# Patient Record
Sex: Male | Born: 1970
Health system: Southern US, Community
[De-identification: ages and names within clinical notes are randomized; demographics above are authoritative.]

## PROBLEM LIST (undated history)

## (undated) DIAGNOSIS — M109 Gout, unspecified: Secondary | ICD-10-CM

## (undated) HISTORY — PX: ACHILLES TENDON SURGERY: SHX542

---

## 2004-08-15 ENCOUNTER — Emergency Department: Payer: Self-pay | Admitting: Emergency Medicine

## 2005-03-21 ENCOUNTER — Ambulatory Visit: Payer: Self-pay | Admitting: Orthopaedic Surgery

## 2005-03-25 ENCOUNTER — Ambulatory Visit: Payer: Self-pay | Admitting: Orthopaedic Surgery

## 2011-04-05 ENCOUNTER — Ambulatory Visit (INDEPENDENT_AMBULATORY_CARE_PROVIDER_SITE_OTHER): Payer: Self-pay | Admitting: Sports Medicine

## 2011-04-05 ENCOUNTER — Encounter: Payer: Self-pay | Admitting: Sports Medicine

## 2011-04-05 DIAGNOSIS — M7660 Achilles tendinitis, unspecified leg: Secondary | ICD-10-CM | POA: Insufficient documentation

## 2011-04-05 DIAGNOSIS — M79609 Pain in unspecified limb: Secondary | ICD-10-CM

## 2011-04-05 DIAGNOSIS — M109 Gout, unspecified: Secondary | ICD-10-CM

## 2011-04-05 DIAGNOSIS — M79674 Pain in right toe(s): Secondary | ICD-10-CM | POA: Insufficient documentation

## 2011-04-05 MED ORDER — DICLOFENAC SODIUM 75 MG PO TBEC: DELAYED_RELEASE_TABLET | ORAL | Status: DC

## 2011-04-05 NOTE — Patient Instructions (Signed)
Start diclofenac as directed  Please have lab work done  Schedule an appointment to have custom orthotics made  Thank you for seeing [place] today!

## 2011-04-05 NOTE — Progress Notes (Signed)
  Subjective:    Patient ID: Darius Robinson, male    DOB: Mar 07, 1971, 41 y.o.   MRN: [id number]  HPI  Pt presents to clinic for evaluation of rt great toe pain at 1st MTP x 3 days. Went for a long run on treadmill Sunday- increased mileage.   Woke up Monday with very painful and swollen rt 1st MTP joint.  Hx of rt achilles tendon rupture and repair in 2007.  Both ATs still feel tight.  Cycles and getting back into running now, has been increasing mileage lately.      Review of Systems     Objective:   Physical Exam  Patient walks with an obvious limp  The right great toe is reddened and swollen and there is redness that extends up to the midfoot and over to the second metatarsal  The great toe is exquisitely tender to light touch  He has a Morton's foot with a short first toe and has some hallux valgus change and spurring  MSK ultrasound  reveals hypoechoic change around the great toe Probable crystal change in the joint Spurring more on the medial side of the first MTP  Right Achilles tendon is thickened at over 1 cm about 4 cm above the calcaneus and it still shows it to hypoechoic suture line On transverse view there is some mid tendon splitting  Left Achilles tendon shows some nodular thickening about 4 cm above the calcaneus with a maximal width of 0.86      Assessment & Plan:

## 2011-04-05 NOTE — Assessment & Plan Note (Signed)
We will go ahead and check renal function with a BMET and uric acid level  Recheck in 3-4 weeks

## 2011-04-05 NOTE — Assessment & Plan Note (Signed)
We will use high-dose diclofenac therapy to see if we can break this gout attack was 300 mg x2 days then 225x2 days and then 150 daily for the next couple weeks

## 2011-04-05 NOTE — Assessment & Plan Note (Signed)
I think this is triggered by his foot shape and I would like to test his running gait  He needs orthotics and Achilles exercises once his gout resolves  He may be a candidate for nitroglycerin

## 2012-02-26 ENCOUNTER — Ambulatory Visit (INDEPENDENT_AMBULATORY_CARE_PROVIDER_SITE_OTHER): Payer: PRIVATE HEALTH INSURANCE | Admitting: Sports Medicine

## 2012-02-26 VITALS — BP 150/90 | Ht 72.0 in | Wt 200.0 lb

## 2012-02-26 DIAGNOSIS — M7660 Achilles tendinitis, unspecified leg: Secondary | ICD-10-CM

## 2012-02-26 DIAGNOSIS — M25571 Pain in right ankle and joints of right foot: Secondary | ICD-10-CM | POA: Insufficient documentation

## 2012-02-26 DIAGNOSIS — M25579 Pain in unspecified ankle and joints of unspecified foot: Secondary | ICD-10-CM

## 2012-02-26 DIAGNOSIS — M25572 Pain in left ankle and joints of left foot: Secondary | ICD-10-CM

## 2012-02-26 MED ORDER — DICLOFENAC SODIUM 75 MG PO TBEC: DELAYED_RELEASE_TABLET | ORAL | Status: DC

## 2012-02-26 NOTE — Patient Instructions (Signed)
Very nice to meet you.  You do have a bone spur on the front of your foot causing some of the pain.  Wear the compression sleeve with activity and 2 hours afterward.  Ice 20 minutes daily at some time. Where the orthotics in your work out shoes and see if this helps.  Come back in 4 weeks to make sure you are doing better. We can make changes to the orthotic if we need it.  Happy holidays!

## 2012-02-26 NOTE — Progress Notes (Signed)
Chief complaint: Left ankle pain and swelling  History of present illness: Patient is a 41 year old male who over Thanksgiving was playing ultimate Frisbee and soccer and the next day had severe ankle pain and swelling. Patient states since that time he is continued to have swelling and pain mostly the anterior ankle. Patient states from time to time it seems to be hard to walk and it hurts with any type of exercise. Patient 10 days ago did see a podiatrist at [facility] and they did have x-rays which showed an anterior bone spur but did not give him any treatment and told him that he will get better on its own or not. Patient is here for a second opinion. Patient states that he started doing some exercises yesterday which did improve the swelling and states that maybe it helped with some of the pain. Patient though still is unable to do  certain things he used to do on a regular basis. Denies numbness or loss of strength.  Past medical history, social, surgical and family history all reviewed. He was treated here for a flare of gout the past as well as Achilles tendinitis.  14 system review is done and unremarkable as related to the orthopedic problem.  Physical exam Blood pressure 150/90, height 6' (1.829 m), weight 200 lb (90.719 kg). General: No apparent distress alert and oriented x3 mood and affect somewhat normal but blunted. Respiratory: Patient's recent full sentences and does not appear short of breath Skin: Warm dry intact with no signs of infection or rash Neuro: Cranial nerves II through XII are intact, neurovascularly intact in all extremities with 2+ DTRs and 2+ pulses.  Foot exam: Patient does have Morton's toe as well as a short first metatarsal bilaterally. He has bunion formation on both the left and right foot with degenerative spurring noted. Left ankle exam: Patient is tender to palpation over the talus, cuboid and proximal navicular bone on the dorsal aspect of  the foot. Patient does have good full range of motion. He is neurovascularly intact distally. There is no pain over the medial or lateral malleolus. No pain over the base of the fifth metatarsal.  Musculoskeletal ultrasound was performed and interpreted by me today. Patient's ultrasound did show that he does have 2 significant bony spurs on anterior part of his ankle in his forefoot. One is on the proximal aspect of his navicular and one is on the proximal  cuboid. Both these have hypoechoic changes with neovascularization. This does show some acute changes overall but no true fracture appreciated. Patient's ankle joint itself appeared to be without fracture.  Patient does have nodules still stated of his Achilles bilaterally right greater than left but significantly smaller than previous and nontender on exam.

## 2012-02-26 NOTE — Assessment & Plan Note (Signed)
We'll continue to monitor his chronic Achilles tendinitis. This does not seem to be bothering him at this time but we do not want him to compensate secondary to the pain he is feeling with the left ankle. Patient was given sports insults today with heel lift which will hopefully decrease the amount of strain. We'll continue to monitor and will discuss again in 4 weeks' time.

## 2012-02-26 NOTE — Assessment & Plan Note (Addendum)
Patient's left ankle pain is consistent with the spurs with mild arthritic changes seen on ultrasound today. I do think patient though did have an acute flare of this injury. Patient was given a compression sleeve today as well as will do 10 days of anti-inflammatory. Patient given exercises to do to try to help. Patient was also given some insoles with heel lifts to decrease the amount of stress on his Achilles. In addition to this patient was given the first ray post bilaterally to try to decrease the strain on the first toe secondary to his Morton's toe.  Recheck as needed.

## 2012-03-31 ENCOUNTER — Ambulatory Visit: Payer: PRIVATE HEALTH INSURANCE | Admitting: Sports Medicine

## 2012-04-07 ENCOUNTER — Ambulatory Visit (INDEPENDENT_AMBULATORY_CARE_PROVIDER_SITE_OTHER): Payer: PRIVATE HEALTH INSURANCE | Admitting: Sports Medicine

## 2012-04-07 VITALS — BP 125/80 | Ht 72.0 in | Wt 200.0 lb

## 2012-04-07 DIAGNOSIS — M25579 Pain in unspecified ankle and joints of unspecified foot: Secondary | ICD-10-CM

## 2012-04-07 DIAGNOSIS — M25571 Pain in right ankle and joints of right foot: Secondary | ICD-10-CM

## 2012-04-07 NOTE — Progress Notes (Signed)
HPI: Patient is a 42 y/o male who presents for followup of right ankle pain (error in previous documentation reporting left ankle pain). Seen on December 18th.  Ultrasound done that day showing arthritic changes and bony spurs of proximal aspect of his navicular and one on the proximal cuboid.  Patient states that he was noted to have bone spur and small fracture of one of the bones of his foot pointing at navicular.  On December 18th visit, patient was given 10-day course anti-inflammatory, exercises, compression sleeve as well as insoles with heel lifts to decrease stress on Achilles.  Patient reports that his pain is better and he can now run without pain, although he is doing mostly cycling.   No further concerns at this time.  ROS: Negative except as above.  Physical Exam: General: well-developed, well nourished, NAD Right ankle: Limited dorsi-and plantar-flexion; 5/5 strength; neurovascularly intact distally; no point tenderness of navicular, cuneiform, talus, or calcaneus; no swelling or ecchymosis noted; patient does have Morton's toe and bunions of both feet. Thickening of right achilles tendon noted. Scar from previous repair Left ankle and foot: Neurovascularly intact distally.  Limited dorsiflexion with thickening of achilles tendon.  Morton's toe noted.  Has full range of plantar flexion.    A/P: Left ankle pain:  Patient making excellent progress.  Feels that pain is nearly completely resolved.  Will continue with shoe insoles, having patient be as active as he tolerates, continue ankle exercises.  Patient to return if he needs replacement of insoles in shoes.

## 2012-04-07 NOTE — Assessment & Plan Note (Signed)
This is much improved. See the assessment in his progress note. We will keep him on exercises, compression sleeves and sports insoles with lifts. He will return as needed.

## 2015-07-13 ENCOUNTER — Encounter: Payer: Self-pay | Admitting: Family Medicine

## 2015-07-13 ENCOUNTER — Ambulatory Visit (INDEPENDENT_AMBULATORY_CARE_PROVIDER_SITE_OTHER): Payer: Commercial Managed Care - PPO | Admitting: Family Medicine

## 2015-07-13 ENCOUNTER — Ambulatory Visit
Admission: RE | Admit: 2015-07-13 | Discharge: 2015-07-13 | Disposition: A | Discharge status: Home / Self Care | Payer: PRIVATE HEALTH INSURANCE | Source: Ambulatory Visit | Attending: Family Medicine | Admitting: Family Medicine

## 2015-07-13 VITALS — BP 146/98 | Ht 72.0 in | Wt 204.0 lb

## 2015-07-13 DIAGNOSIS — M79674 Pain in right toe(s): Secondary | ICD-10-CM | POA: Diagnosis not present

## 2015-07-13 DIAGNOSIS — M21619 Bunion of unspecified foot: Secondary | ICD-10-CM

## 2015-07-13 MED ORDER — METHYLPREDNISOLONE ACETATE 40 MG/ML IJ SUSP
40.0000 mg | Freq: Once | INTRAMUSCULAR | Status: AC
  Administered 2015-07-13: 40 mg via INTRA_ARTICULAR

## 2015-07-13 MED ORDER — COLCHICINE 0.6 MG PO TABS: 0.6000 mg | ORAL_TABLET | Freq: Every day | ORAL | Status: DC

## 2015-07-13 NOTE — Assessment & Plan Note (Addendum)
Very well could be gout vs irritation of bunion.   Will get standing A/P B/L feet to evaluate for erosions and tophus changes as well as BMP and uric acid.  Injection today into MTP joint for Sx relief and attempt at aspiration.  F/U in 2-4 weeks for orthotics as well as consideration of urate lowering therapy pending results from above.  As well, consider aspiration of tophus appearing lesion of MTP and sending for crystal analysis.    Aspiration/Injection Procedure Note Darius Robinson 01/28/1971  Procedure: Injection Indications: 1st MTP pain  Procedure Details Consent: Risks of procedure as well as the alternatives and risks of each were explained to the (patient/caregiver).  Consent for procedure obtained. Time Out: Verified patient identification, verified procedure, site/side was marked, verified correct patient position, special equipment/implants available, medications/allergies/relevent history reviewed, required imaging and test results available.  Performed.  The area was cleaned with iodine and alcohol swabs.    The R MTP 1st was injected using 0.5 cc's of 40mg  Depomedrol and 0.5 cc's of 1% lidocaine with a 21 1 1/2" needle.  Ultrasound was used. Images were obtained in Transverse and Long views showing the injection.    A sterile dressing was applied.  Patient did tolerate procedure well.  Vasovagal event for 30 seconds and he sat in the exam room for 30 minutes with water, able to ambulate outside.  No bowel/bladder/tongue biting or shaking.  Remembers specific event and stable at time of d/c.  Estimated blood loss: None

## 2015-07-13 NOTE — Progress Notes (Signed)
  Darius Robinson - 45 y.o. male MRN [id number]  Date of birth: [birth year] Darius Robinson is a 45 y.o. male who presents today for right toe pain.  R first MTP joint, initial visit, 07/13/15 - patient presents today for ongoing right first MTP pain and erythema. This began to 2 weeks ago after working out and has not subsided. He does have a history of gout thinks that this may be similar. Denies any previous injury to his right or left foot. There is protuberances on both medial aspects of his first MTP joints. He has not tried orthotics medication or anything else to date. No paresthesias going distally. He denies any fevers chills night sweats or spreading erythema.  PMHx - Updated and reviewed.  Contributory factors include: gout PSHx - Updated and reviewed.  Contributory factors include:  Negative  FHx - Updated and reviewed.  Contributory factors include:  Negative Social Hx - Updated and reviewed. Contributory factors include: Previous smoker  Medications - none   ROS Per HPI.  12 point negative other than per HPI.   Exam:  Filed Vitals:   07/13/15 0942  BP: 146/98   Gen: NAD, AAO 3 Cardio- RRR Pulm - Normal respiratory effort/rate Skin: No rashes or erythema Extremities: No edema  Vascular: pulses +2 bilateral upper and lower extremity Psych: Normal affect  Foot:  1st MTP erythema w/o warmth but TTP and slight hallux valgus on R.  Full ROM.  L with protuberance of medial MTP joint and minimal hallux valgus.  No warmth or erythema.    Imaging:  A/P B/L Foot

## 2015-07-14 ENCOUNTER — Telehealth: Payer: Self-pay | Admitting: Family Medicine

## 2015-07-14 NOTE — Telephone Encounter (Signed)
LVM for pt to call back regarding his x-rays.  Both L&R 1st MTP joint show significant OA and bunion deformity.  Gout cannot be excluded.  Would recommend the labs that are ordered and f/u after this.  If he has any questions, please relay the message back and I will call him again.  Thanks, Twana FirstBryan R. Paulina FusiHess, DO [facility] 07/14/2015, 11:17 AM

## 2015-08-09 ENCOUNTER — Ambulatory Visit: Payer: PRIVATE HEALTH INSURANCE | Admitting: Family Medicine

## 2015-08-11 ENCOUNTER — Encounter: Payer: Self-pay | Admitting: Sports Medicine

## 2015-08-16 ENCOUNTER — Ambulatory Visit: Payer: PRIVATE HEALTH INSURANCE | Admitting: Family Medicine

## 2015-08-23 ENCOUNTER — Ambulatory Visit (INDEPENDENT_AMBULATORY_CARE_PROVIDER_SITE_OTHER): Payer: Commercial Managed Care - PPO | Admitting: Family Medicine

## 2015-08-23 ENCOUNTER — Encounter: Payer: Self-pay | Admitting: Family Medicine

## 2015-08-23 VITALS — BP 107/88 | HR 67 | Ht 72.0 in | Wt 200.0 lb

## 2015-08-23 DIAGNOSIS — M10079 Idiopathic gout, unspecified ankle and foot: Secondary | ICD-10-CM

## 2015-08-23 DIAGNOSIS — R269 Unspecified abnormalities of gait and mobility: Secondary | ICD-10-CM

## 2015-08-23 DIAGNOSIS — M79674 Pain in right toe(s): Secondary | ICD-10-CM | POA: Diagnosis not present

## 2015-08-23 DIAGNOSIS — M109 Gout, unspecified: Secondary | ICD-10-CM

## 2015-08-23 NOTE — Progress Notes (Signed)
  Candis ShineJames A Plummer - 45 y.o. male MRN [id number]  Date of birth: 03/04/1971 Candis ShineJames A Izquierdo is a 45 y.o. male who presents today for right toe pain.  R first MTP joint, initial visit, 07/13/15 - patient presents today for ongoing right first MTP pain and erythema. This began to 2 weeks ago after working out and has not subsided. He does have a history of gout thinks that this may be similar. Denies any previous injury to his right or left foot. There is protuberances on both medial aspects of his first MTP joints. He has not tried orthotics medication or anything else to date. No paresthesias going distally. He denies any fevers chills night sweats or spreading erythema.  Right first MTP pain, follow-up visit 08/23/15-patient presents today follow-up of right first MTP pain. Greatly improved 2-3 days after her intra-articular injection. Has not had recurrence since then. 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 There is also evidence of gout with his uric acid level VIII.4 and slightly elevated creatinine at 1.2.  PMHx - Updated and reviewed.  Contributory factors include: gout PSHx - Updated and reviewed.  Contributory factors include:  Negative  FHx - Updated and reviewed.  Contributory factors include:  Negative Social Hx - Updated and reviewed. Contributory factors include: Previous smoker  Medications - none   ROS Per HPI.  12 point negative other than per HPI.   Exam:  Filed Vitals:   08/23/15 1419  BP: 107/88  Pulse: 67   Gen: NAD, AAO 3 Cardio- RRR Pulm - Normal respiratory effort/rate Skin: No rashes or erythema Extremities: No edema  Vascular: pulses +2 bilateral upper and lower extremity Psych: Normal affect  Foot:  1st MTP erythema w/o warmth but TTP and slight hallux valgus on R.  Full ROM.  L with protuberance of medial MTP joint and  minimal hallux valgus.  No warmth or erythema.    Imaging:  A/P B/L Foot - punched out lesions first distal metatarsal as well as soft tissue hyperlucency's at the medial first MTP joint. Minimal hallux valgus deformities bilaterally.

## 2015-08-23 NOTE — Assessment & Plan Note (Signed)
Patient was fitted for a standard, cushioned, semi-rigid orthotic. The orthotic was heated and afterward the patient stood on the orthotic blank positioned on the orthotic stand. The patient was positioned in subtalar neutral position and 10 degrees of ankle dorsiflexion in a weight bearing stance. After completion of molding, a stable base was applied to the orthotic blank. The blank was ground to a stable position for weight bearing. Size: 11 Base: blue EVA Additional Posting and Padding: 1st ray post The patient ambulated these, and they were very comfortable.  I spent 40 minutes with this patient, greater than 50% was face-to-face time counseling regarding the below diagnosis.

## 2015-08-23 NOTE — Assessment & Plan Note (Addendum)
I would diagnose patient with gout at this point however we do not have a true crystal analysis. However his x-ray has classic gout findings including tophus as well as punched out lesions of his first MTP joint bilaterally. As well as uric acid levels 8.4 which is extremely high. Although not having gout attacks greater than 2-3 per year with his underlying tophus formation at highly recommend urate lowering therapy. He will talk to his PCP about starting urloric vs allopurinol with properly monitoring drug levels and labs.

## 2015-08-23 NOTE — Assessment & Plan Note (Addendum)
Patient with underlying hallux valgus deformity right greater than left as well. With his underlying foot structure and abnormality of gait we will go ahead and fit him with orthotics with the first ray MTP post. Follow-up 6 weeks

## 2015-10-04 ENCOUNTER — Ambulatory Visit: Payer: Commercial Managed Care - PPO | Admitting: Sports Medicine

## 2016-04-22 ENCOUNTER — Encounter: Payer: Self-pay | Admitting: Family Medicine

## 2016-04-22 ENCOUNTER — Ambulatory Visit (INDEPENDENT_AMBULATORY_CARE_PROVIDER_SITE_OTHER): Payer: PRIVATE HEALTH INSURANCE | Admitting: Family Medicine

## 2016-04-22 VITALS — BP 130/78 | HR 79 | Temp 97.7°F | Resp 16 | Wt 224.0 lb

## 2016-04-22 DIAGNOSIS — M5441 Lumbago with sciatica, right side: Secondary | ICD-10-CM

## 2016-04-22 DIAGNOSIS — G8929 Other chronic pain: Secondary | ICD-10-CM

## 2016-04-22 DIAGNOSIS — M545 Low back pain, unspecified: Secondary | ICD-10-CM | POA: Insufficient documentation

## 2016-04-22 DIAGNOSIS — M109 Gout, unspecified: Secondary | ICD-10-CM

## 2016-04-22 MED ORDER — COLCHICINE 0.6 MG PO TABS: ORAL_TABLET | ORAL | 1 refills | Status: DC

## 2016-04-22 MED ORDER — INDOMETHACIN 50 MG PO CAPS: 50.0000 mg | ORAL_CAPSULE | Freq: Two times a day (BID) | ORAL | 5 refills | Status: DC | PRN

## 2016-04-22 NOTE — Progress Notes (Signed)
       Patient: Darius ShineJames A Hollenbeck Male    DOB: [birth year]   46 y.o.   MRN: [id number] Visit Date: 04/22/2016  Today's Provider: Mila Merryonald Fisher, MD   Chief Complaint  Patient presents with  . Joint Swelling   Subjective:    Left ankle and foot is swollen for 5 days. Patient does not recall injuring foot. Left foot is painful all the time. Some rednees in the foot.   Does not recall in recent injury. Has been exercising much more the last several weeks. Also has history of elevated uric acid and gout which he had controlled with diet and avoiding alcohol.  Has also had redness and swelling over left great toe for several days.   He also has long history of right sided low back pain which radiates into right lateral leg. Had tried multiple NSAIDs without relief. Has been treated by two chiropractors multiple times for at least 6 months over the last two years with no relief from his pain. Most recent treatment was 40 sessions with  Dr. Chriss Czarumayne with only temporary but incomplete improvement of pain. Often feels weak in leg, but no falls. He desires evaluation for possible invasive treatment.      No Known Allergies   Current Outpatient Prescriptions:  .  colchicine 0.6 MG tablet, Take 1 tablet (0.6 mg total) by mouth daily. (Patient not taking: Reported on 04/22/2016), Disp: 30 tablet, Rfl: 1  Review of Systems  Constitutional: Negative for appetite change, chills and fever.  Respiratory: Negative for chest tightness, shortness of breath and wheezing.   Cardiovascular: Negative for chest pain and palpitations.  Gastrointestinal: Negative for abdominal pain, nausea and vomiting.  Musculoskeletal: Positive for joint swelling.    Social History  Substance Use Topics  . Smoking status: Never Smoker  . Smokeless tobacco: Former NeurosurgeonUser    Types: Chew    Quit date: 03/11/2001  . Alcohol use Not on file   Objective:   BP 130/78 (BP Location: Right Arm, Patient Position: Sitting, Cuff Size:  Large)   Pulse 79   Temp 97.7 F (36.5 C) (Oral)   Resp 16   Wt 224 lb (101.6 kg)   SpO2 97%   BMI 30.38 kg/m   Physical Exam General appearance: alert, well developed, well nourished, cooperative and in no distress Head: Normocephalic, without obvious abnormality, atraumatic Red and swollen over left first MTP. Mild swelling and tenderness left medial ankle. Pain with joint movement. Minimal erythema.  Tender low back, R<L. Positive right straight leg. Normal s/s +4 right hip abduction.     Assessment & Plan:     1. Chronic right-sided low back pain with right-sided sciatica Failed extensive chiropractic therapy and oral medications. Will likely require invasive treatment.  - MR Lumbar Spine Wo Contrast; Future  2. Acute gout of foot, unspecified cause, unspecified laterality  - colchicine 0.6 MG tablet; Take 2 tablets for one day, then one daily as needed.  Dispense: 30 tablet; Refill: 1 - indomethacin (INDOCIN) 50 MG capsule; Take 1 capsule (50 mg total) by mouth 2 (two) times daily as needed.  Dispense: 20 capsule; Refill: 5  Discussed benefit of allopurinol to reduced allopurinol in serum and gout prophylaxis. He will consider starting after acute flare has resolved.       Mila Merryonald Fisher, MD  [facility]

## 2016-05-13 ENCOUNTER — Ambulatory Visit: Payer: Commercial Managed Care - PPO

## 2016-05-20 ENCOUNTER — Ambulatory Visit
Admission: RE | Admit: 2016-05-20 | Discharge: 2016-05-20 | Disposition: A | Discharge status: Home / Self Care | Payer: BLUE CROSS/BLUE SHIELD | Source: Ambulatory Visit | Attending: Family Medicine | Admitting: Family Medicine

## 2016-05-20 DIAGNOSIS — G8929 Other chronic pain: Secondary | ICD-10-CM | POA: Diagnosis not present

## 2016-05-20 DIAGNOSIS — M48061 Spinal stenosis, lumbar region without neurogenic claudication: Secondary | ICD-10-CM | POA: Diagnosis not present

## 2016-05-20 DIAGNOSIS — M4807 Spinal stenosis, lumbosacral region: Secondary | ICD-10-CM | POA: Insufficient documentation

## 2016-05-20 DIAGNOSIS — M5441 Lumbago with sciatica, right side: Secondary | ICD-10-CM | POA: Insufficient documentation

## 2016-05-21 ENCOUNTER — Telehealth: Payer: Self-pay | Admitting: *Deleted

## 2016-05-21 DIAGNOSIS — M5126 Other intervertebral disc displacement, lumbar region: Secondary | ICD-10-CM

## 2016-05-21 DIAGNOSIS — M5136 Other intervertebral disc degeneration, lumbar region: Secondary | ICD-10-CM

## 2016-05-21 NOTE — Telephone Encounter (Signed)
Please schedule referral to neurosurgery. Thanks! 

## 2016-05-21 NOTE — Telephone Encounter (Signed)
Patient was notified of results. Expressed understanding. Referral to neurosurgery in epic.

## 2016-05-21 NOTE — Telephone Encounter (Signed)
-----   Message from Malva Limesonald E Fisher, MD sent at 05/20/2016  3:10 PM EDT ----- MRI shows 2 bulging discs in spine, probably pinching spinal nerve. Need referral to neurosurgery for further evaluation and treatment.

## 2016-12-08 DIAGNOSIS — J0110 Acute frontal sinusitis, unspecified: Secondary | ICD-10-CM | POA: Diagnosis not present

## 2017-03-05 ENCOUNTER — Other Ambulatory Visit: Payer: Self-pay | Admitting: Family Medicine

## 2017-03-05 MED ORDER — COLCHICINE 0.6 MG PO TABS: ORAL_TABLET | ORAL | 0 refills | Status: DC

## 2017-03-05 NOTE — Progress Notes (Signed)
Change colchicine to Mitagare or Colcrys per notice from [facility].

## 2017-04-12 DIAGNOSIS — J0100 Acute maxillary sinusitis, unspecified: Secondary | ICD-10-CM | POA: Diagnosis not present

## 2017-07-29 IMAGING — CR DG FOOT 2V*R*
2 series · 2 of 2 positions shown · non-contrast
Comparison: No prior .

CLINICAL DATA: Bunions.  Pain.  Initial evaluation.

EXAM:
RIGHT FOOT - 2 VIEW

[t foot ap right]
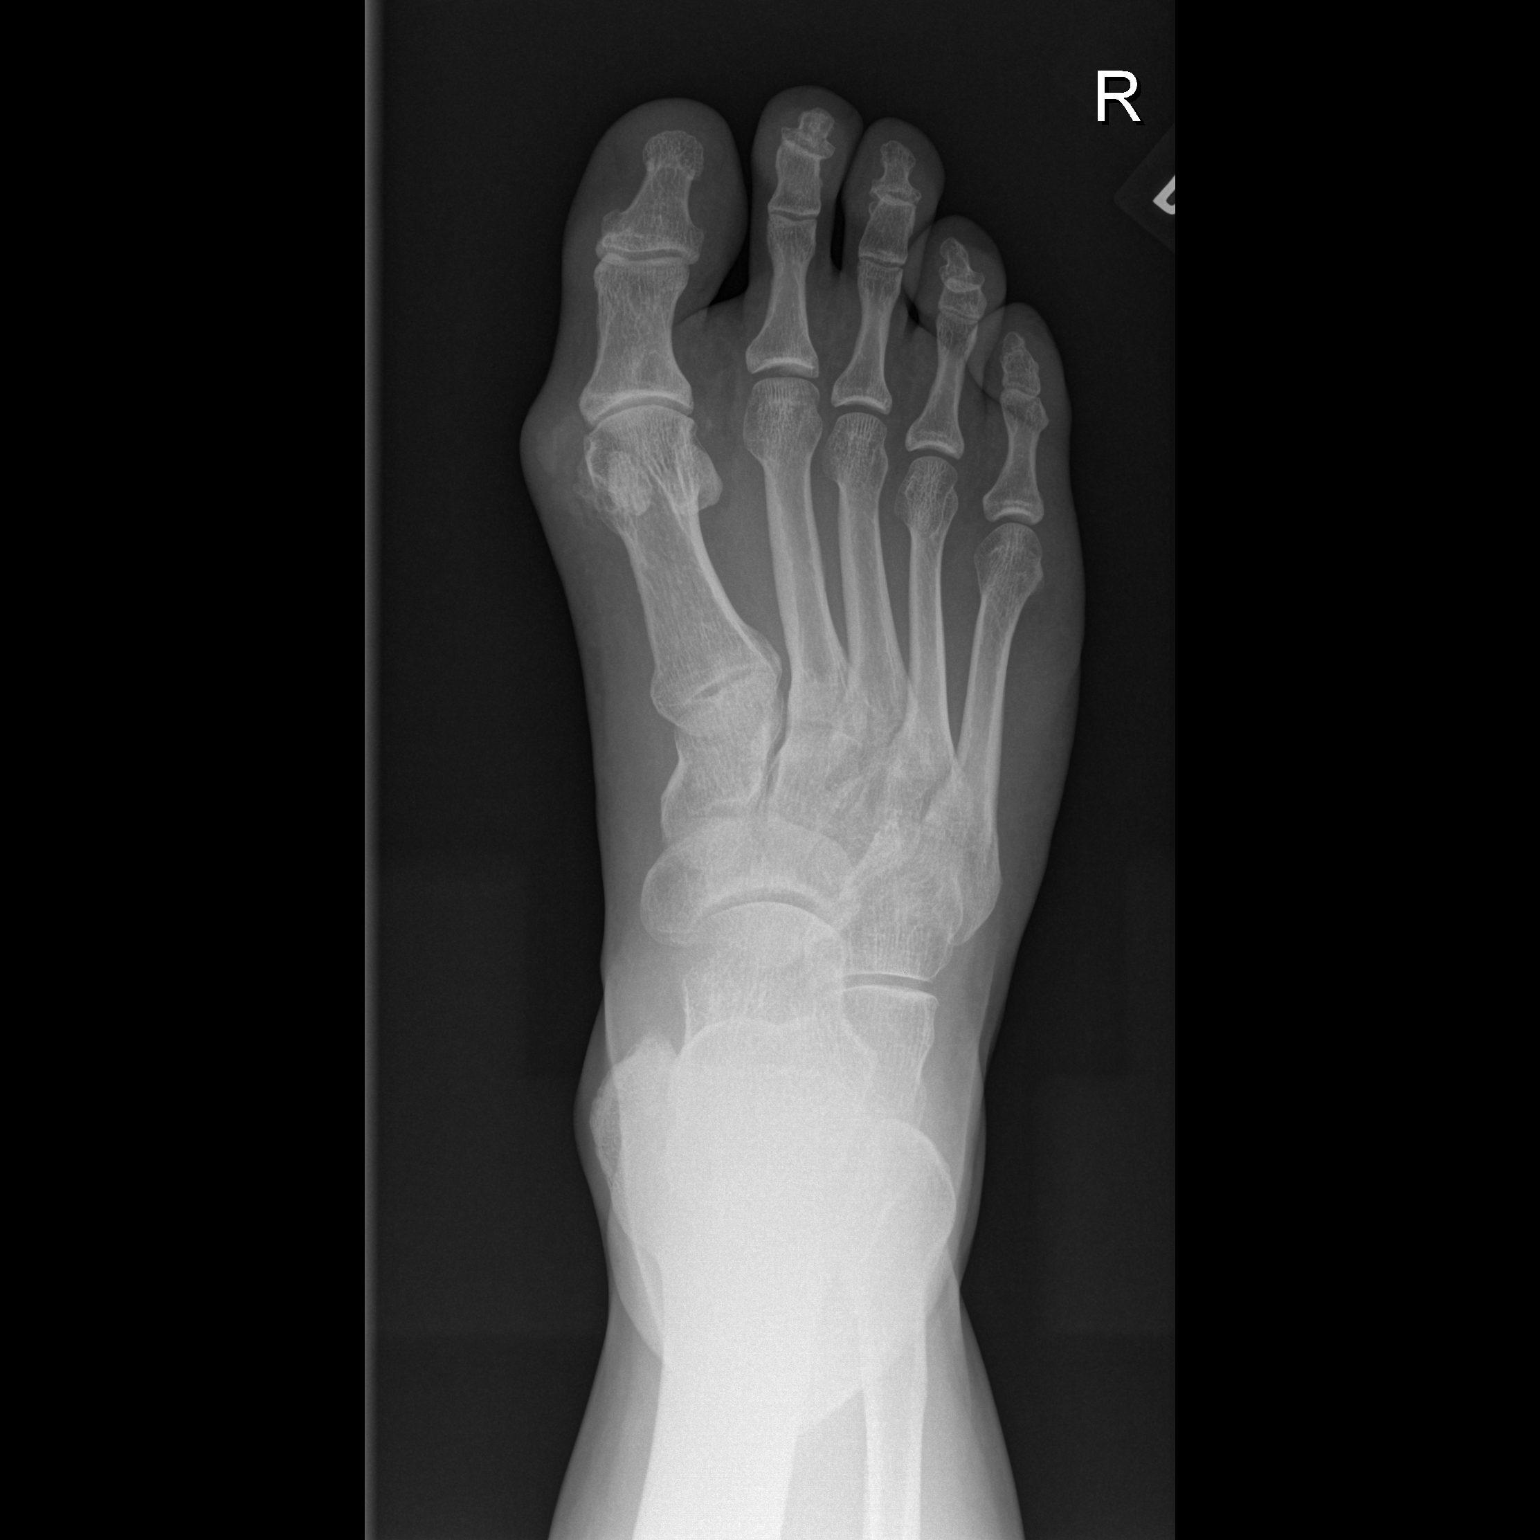

[t foot lat right]
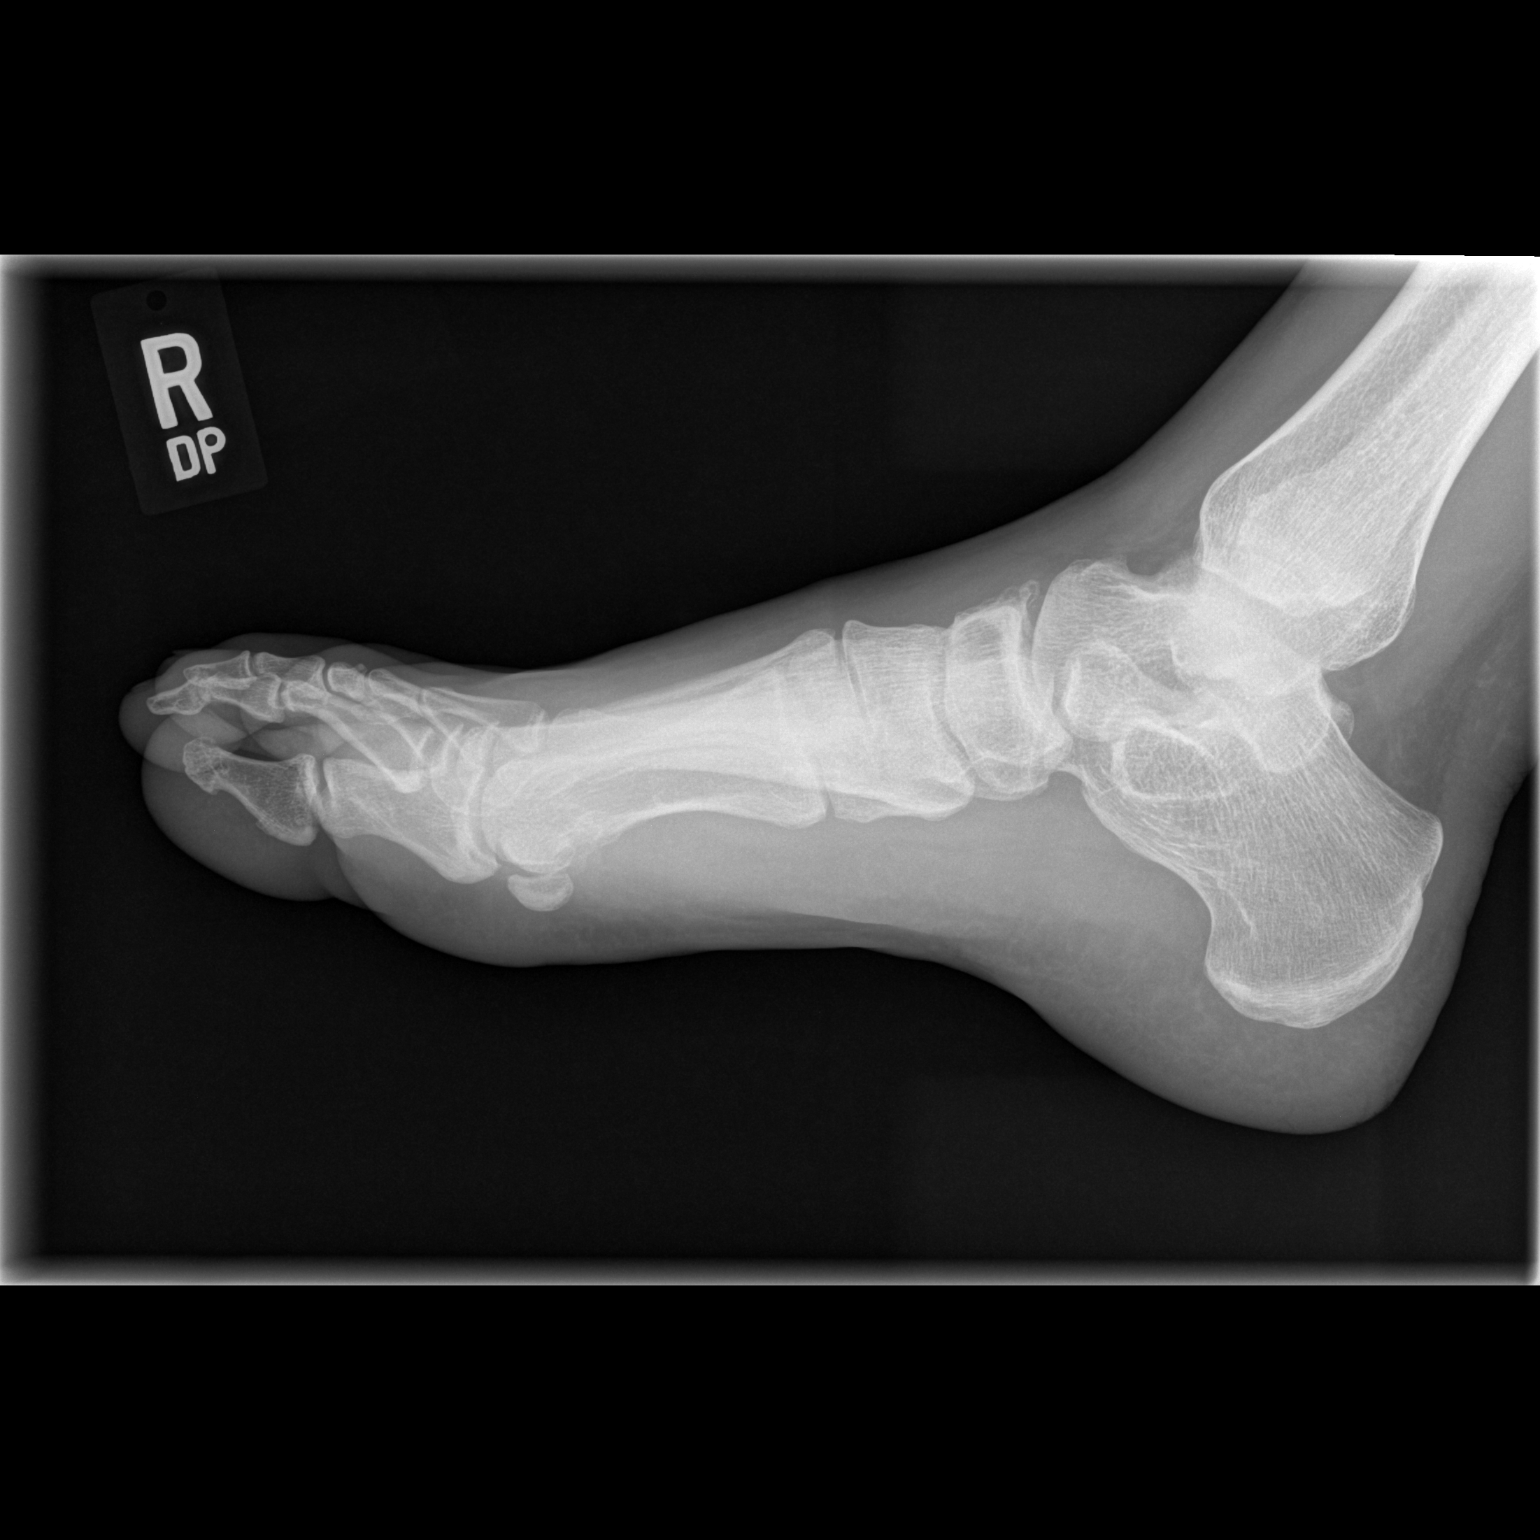

[2 of 2 positions shown; findings below may reference images not displayed]

FINDINGS: Prominent right first metatarsophalangeal joint degenerative change.
Adjacent soft tissue swelling is present. Mild adjacent soft tissue
calcification cannot be excluded. These changes may be related to a
bunion. Gout cannot be completely excluded. No bony erosions. No
other focal abnormality identified.
IMPRESSION: Findings most consistent with right first metatarsophalangeal joint
degenerative change with bunion. Gout in this region cannot be
completely excluded. No prominent bony erosions noted. No evidence
of fracture or dislocation.

## 2017-07-29 IMAGING — CR DG FOOT 2V*L*
2 series · 2 of 2 positions shown · non-contrast
Comparison: No prior.

CLINICAL DATA: Bunions.  Pain.

EXAM:
LEFT FOOT - 2 VIEW

[t foot ap left]
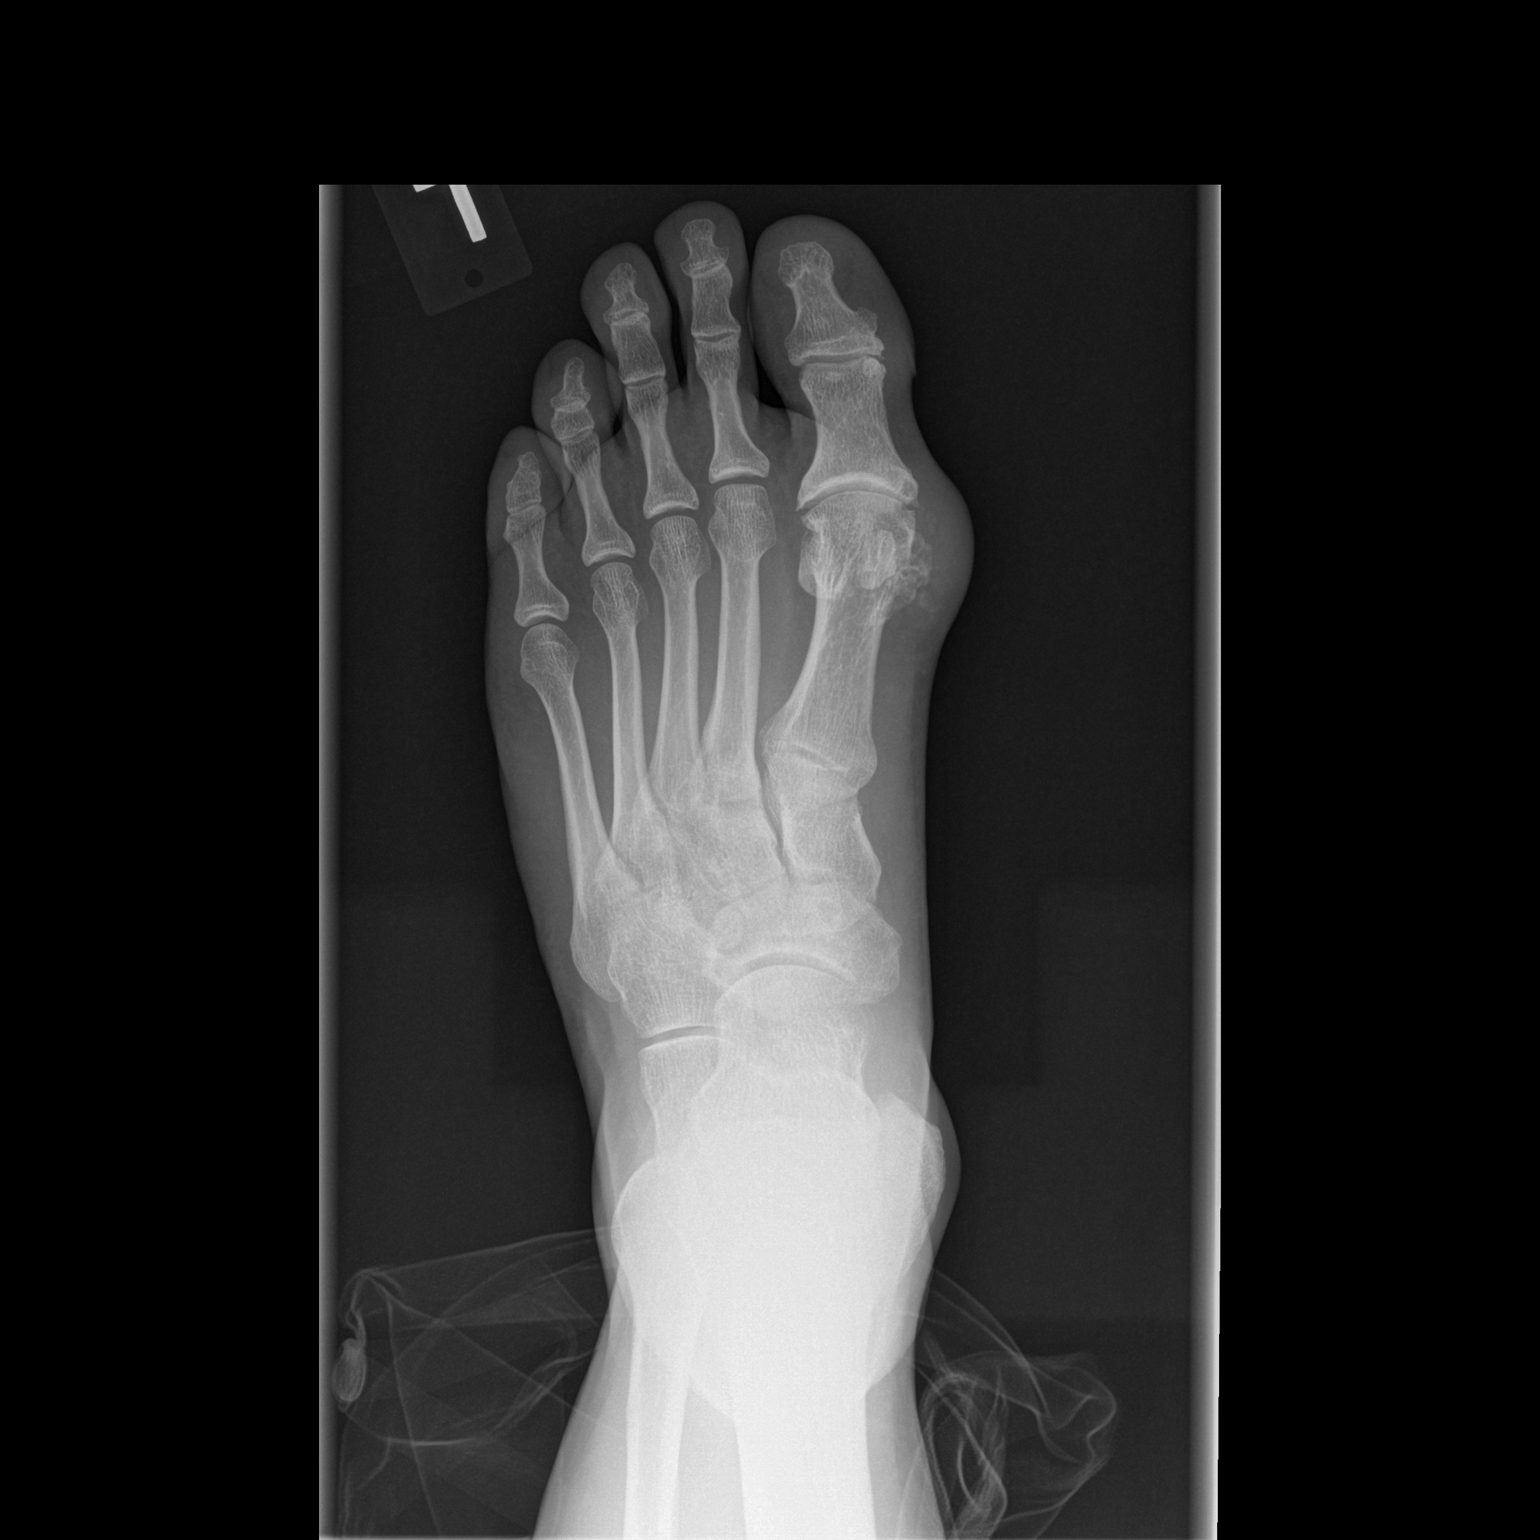

[t foot lat left]
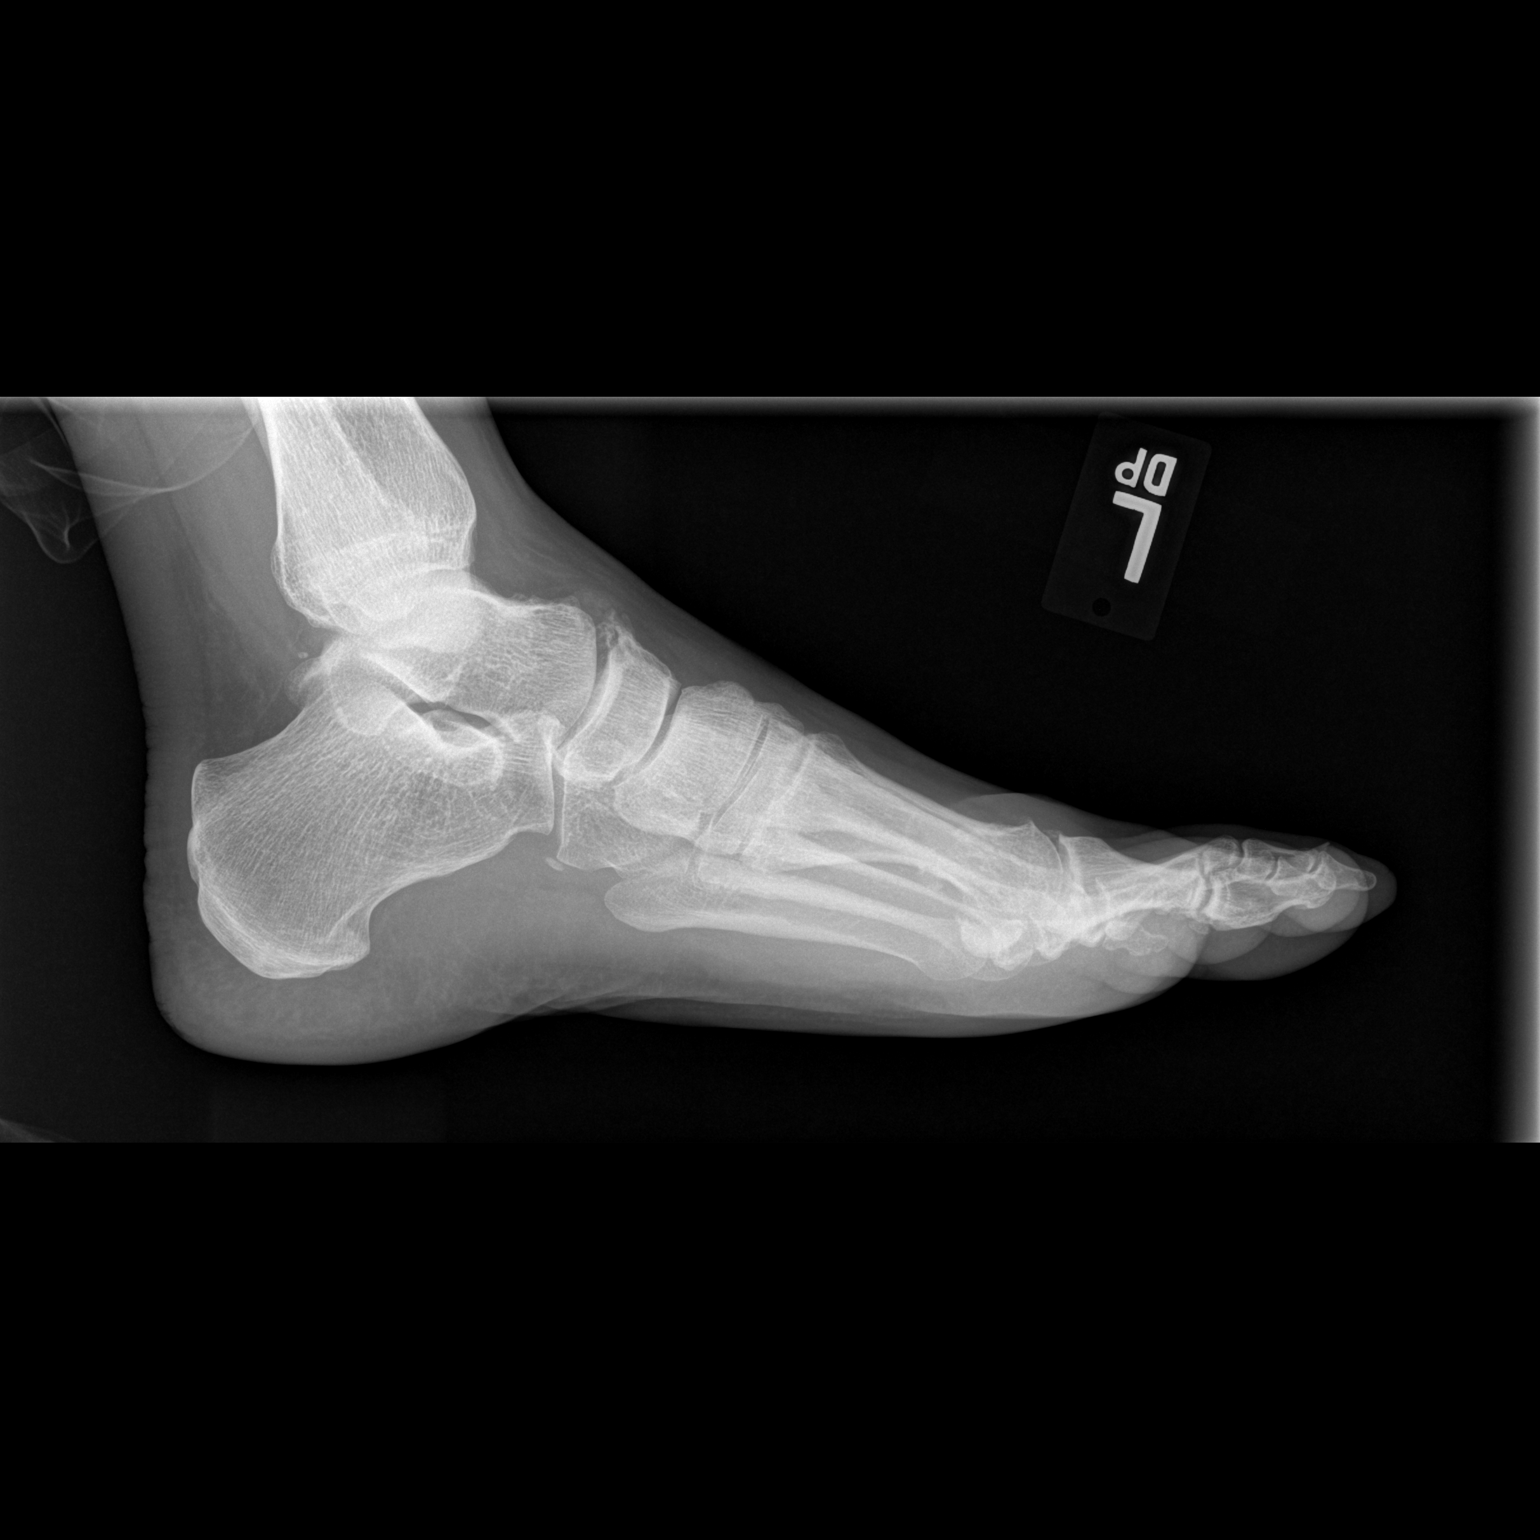

[2 of 2 positions shown; findings below may reference images not displayed]

FINDINGS: Prominent left first metatarsophalangeal joint degenerative change
noted. Prominent soft tissue swelling noted about this joint with
adjacent the soft tissue calcifications. These findings are most
consistent with bunion. Gout cannot be completely excluded. No bony
erosions noted. Diffuse degenerative change pre
IMPRESSION: Findings most consistent with prominent left first
metatarsophalangeal joint degenerative change with bunion.

## 2017-09-08 ENCOUNTER — Encounter: Payer: Self-pay | Admitting: Family Medicine

## 2017-09-08 ENCOUNTER — Telehealth: Payer: Self-pay | Admitting: Family Medicine

## 2017-09-08 ENCOUNTER — Ambulatory Visit: Payer: BLUE CROSS/BLUE SHIELD | Admitting: Family Medicine

## 2017-09-08 VITALS — BP 108/70 | HR 63 | Temp 98.1°F | Resp 15 | Wt 221.4 lb

## 2017-09-08 DIAGNOSIS — M109 Gout, unspecified: Secondary | ICD-10-CM

## 2017-09-08 MED ORDER — COLCHICINE 0.6 MG PO TABS: ORAL_TABLET | ORAL | 1 refills | Status: AC

## 2017-09-08 MED ORDER — COLCHICINE 0.6 MG PO TABS
ORAL_TABLET | ORAL | 1 refills | Status: DC
Start: 2017-09-08 — End: 2017-09-08

## 2017-09-08 NOTE — Telephone Encounter (Signed)
Patient seen and medication reordered.

## 2017-09-08 NOTE — Patient Instructions (Signed)
Let us know if not improving. Get the labs when you are back to normal.

## 2017-09-08 NOTE — Telephone Encounter (Signed)
Pt contacted office for refill request on the following medications:  colchicine 0.6 MG tablet   [facility]  Pt stated they are about to leave to go out of town and he has had a gout flare up.  LOV: 04/22/16 Please advise. Thanks TNP

## 2017-09-08 NOTE — Progress Notes (Signed)
  Subjective:     Patient ID: Darius ShineJames A Loch, male   DOB: 06/04/1970, 47 y.o.   MRN: [id number] Chief Complaint  Patient presents with  . Gout    Patient comes in office today with complaints of gout flare to his right great toe for the past six days.    HPI States he ate a combination of beer and shellfish which triggered a flare. Reports it has been > one year since he has had a flare. Usually treast with colchicine but is out of medication. Wishes to update his uric acid once flare is over.  Review of Systems     Objective:   Physical Exam  Constitutional: He appears well-developed and well-nourished. No distress.  Musculoskeletal:  Inflamed, tender, swollen right MTP joint. L MTP with prominent bunion.       Assessment:    1. Gouty arthritis of right great toe: rx for colchicine - Renal function panel - Uric acid    Plan:    Call if not improving and further f/u when lab results available.

## 2017-09-08 NOTE — Telephone Encounter (Signed)
Pt called back for status update and scheduled OV with Bob at 2 pm. Please advise. Thanks TNP

## 2018-05-25 DIAGNOSIS — M109 Gout, unspecified: Secondary | ICD-10-CM | POA: Diagnosis not present

## 2018-07-30 DIAGNOSIS — M1A00X0 Idiopathic chronic gout, unspecified site, without tophus (tophi): Secondary | ICD-10-CM | POA: Diagnosis not present

## 2018-08-24 DIAGNOSIS — Z79899 Other long term (current) drug therapy: Secondary | ICD-10-CM | POA: Diagnosis not present

## 2018-08-24 DIAGNOSIS — M1A00X0 Idiopathic chronic gout, unspecified site, without tophus (tophi): Secondary | ICD-10-CM | POA: Diagnosis not present

## 2018-08-24 DIAGNOSIS — M21619 Bunion of unspecified foot: Secondary | ICD-10-CM | POA: Diagnosis not present

## 2018-08-24 DIAGNOSIS — M2010 Hallux valgus (acquired), unspecified foot: Secondary | ICD-10-CM | POA: Diagnosis not present

## 2018-08-26 DIAGNOSIS — M21619 Bunion of unspecified foot: Secondary | ICD-10-CM | POA: Diagnosis not present

## 2018-08-26 DIAGNOSIS — M79672 Pain in left foot: Secondary | ICD-10-CM | POA: Diagnosis not present

## 2018-08-26 DIAGNOSIS — M79671 Pain in right foot: Secondary | ICD-10-CM | POA: Diagnosis not present

## 2018-08-26 DIAGNOSIS — M2022 Hallux rigidus, left foot: Secondary | ICD-10-CM | POA: Diagnosis not present

## 2018-10-01 DIAGNOSIS — M19021 Primary osteoarthritis, right elbow: Secondary | ICD-10-CM | POA: Diagnosis not present

## 2018-10-14 DIAGNOSIS — M19021 Primary osteoarthritis, right elbow: Secondary | ICD-10-CM | POA: Diagnosis not present

## 2019-02-10 ENCOUNTER — Other Ambulatory Visit: Payer: Self-pay

## 2019-02-10 DIAGNOSIS — Z20822 Contact with and (suspected) exposure to covid-19: Secondary | ICD-10-CM

## 2019-02-13 LAB — NOVEL CORONAVIRUS, NAA: SARS-CoV-2, NAA: NOT DETECTED

## 2019-02-23 DIAGNOSIS — M2012 Hallux valgus (acquired), left foot: Secondary | ICD-10-CM | POA: Diagnosis not present

## 2019-02-23 DIAGNOSIS — M2022 Hallux rigidus, left foot: Secondary | ICD-10-CM | POA: Diagnosis not present

## 2019-02-23 DIAGNOSIS — G8918 Other acute postprocedural pain: Secondary | ICD-10-CM | POA: Diagnosis not present

## 2019-04-07 DIAGNOSIS — M79672 Pain in left foot: Secondary | ICD-10-CM | POA: Diagnosis not present

## 2019-05-12 DIAGNOSIS — M2022 Hallux rigidus, left foot: Secondary | ICD-10-CM | POA: Diagnosis not present

## 2022-12-06 ENCOUNTER — Encounter: Payer: Self-pay | Admitting: *Deleted

## 2023-01-06 ENCOUNTER — Telehealth: Payer: Self-pay

## 2023-01-06 ENCOUNTER — Telehealth: Payer: Self-pay | Admitting: *Deleted

## 2023-01-06 ENCOUNTER — Other Ambulatory Visit: Payer: Self-pay | Admitting: *Deleted

## 2023-01-06 DIAGNOSIS — Z1211 Encounter for screening for malignant neoplasm of colon: Secondary | ICD-10-CM

## 2023-01-06 MED ORDER — NA SULFATE-K SULFATE-MG SULF 17.5-3.13-1.6 GM/177ML PO SOLN: 1.0000 | Freq: Once | ORAL | 0 refills | Status: AC

## 2023-01-06 NOTE — Telephone Encounter (Signed)
Colonoscopy schedule on 02/28/2023

## 2023-01-06 NOTE — Telephone Encounter (Signed)
Gastroenterology Pre-Procedure Review  Request Date: 02/28/2023 Requesting Physician: Dr. Allegra Lai  PATIENT REVIEW QUESTIONS: The patient responded to the following health history questions as indicated:    1. Are you having any GI issues? no 2. Do you have a personal history of Polyps? no 3. Do you have a family history of Colon Cancer or Polyps? no 4. Diabetes Mellitus? no 5. Joint replacements in the past 12 months?no 6. Major health problems in the past 3 months?no 7. Any artificial heart valves, MVP, or defibrillator?no    MEDICATIONS & ALLERGIES:    Patient reports the following regarding taking any anticoagulation/antiplatelet therapy:   Plavix, Coumadin, Eliquis, Xarelto, Lovenox, Pradaxa, Brilinta, or Effient? no Aspirin? no  Patient confirms/reports the following medications:  Current Outpatient Medications  Medication Sig Dispense Refill   colchicine 0.6 MG tablet Take 2 tablets for one day, then one daily as needed. 30 tablet 1   No current facility-administered medications for this visit.    Patient confirms/reports the following allergies:  No Known Allergies  No orders of the defined types were placed in this encounter.   AUTHORIZATION INFORMATION Primary Insurance: 1D#: Group #:  Secondary Insurance: 1D#: Group #:  SCHEDULE INFORMATION: Date: 02/28/2023 Time: Location: [facility]

## 2023-01-06 NOTE — Telephone Encounter (Signed)
Patient called in to schedule his procedure.

## 2023-01-07 ENCOUNTER — Telehealth: Payer: Self-pay

## 2023-01-07 NOTE — Telephone Encounter (Signed)
Per voice mail pt is on business trip will call [employer] to give updated insurance information when he is free.

## 2023-02-27 ENCOUNTER — Encounter: Payer: Self-pay | Admitting: Gastroenterology

## 2023-02-28 ENCOUNTER — Encounter
Admission: RE | Disposition: A | Discharge status: Home / Self Care | Payer: Self-pay | Source: Home / Self Care | Attending: Gastroenterology

## 2023-02-28 ENCOUNTER — Encounter: Payer: Self-pay | Admitting: Gastroenterology

## 2023-02-28 ENCOUNTER — Ambulatory Visit: Payer: BC Managed Care – PPO | Admitting: Anesthesiology

## 2023-02-28 ENCOUNTER — Ambulatory Visit
Admission: RE | Admit: 2023-02-28 | Discharge: 2023-02-28 | Disposition: A | Discharge status: Home / Self Care | Payer: BC Managed Care – PPO | Attending: Gastroenterology | Admitting: Gastroenterology

## 2023-02-28 DIAGNOSIS — K5730 Diverticulosis of large intestine without perforation or abscess without bleeding: Secondary | ICD-10-CM | POA: Diagnosis not present

## 2023-02-28 DIAGNOSIS — Z1211 Encounter for screening for malignant neoplasm of colon: Secondary | ICD-10-CM | POA: Diagnosis present

## 2023-02-28 DIAGNOSIS — K635 Polyp of colon: Secondary | ICD-10-CM

## 2023-02-28 HISTORY — PX: COLONOSCOPY WITH PROPOFOL: SHX5780

## 2023-02-28 HISTORY — PX: POLYPECTOMY: SHX5525

## 2023-02-28 HISTORY — DX: Gout, unspecified: M10.9

## 2023-02-28 SURGERY — COLONOSCOPY WITH PROPOFOL
Anesthesia: General

## 2023-02-28 MED ORDER — PROPOFOL 1000 MG/100ML IV EMUL
INTRAVENOUS | Status: AC
Start: 2023-02-28 — End: ?
  Filled 2023-02-28: qty 100

## 2023-02-28 MED ORDER — PROPOFOL 500 MG/50ML IV EMUL
INTRAVENOUS | Status: DC | PRN
  Administered 2023-02-28: 125 ug/kg/min via INTRAVENOUS

## 2023-02-28 MED ORDER — LIDOCAINE HCL (PF) 2 % IJ SOLN
INTRAMUSCULAR | Status: AC
  Filled 2023-02-28: qty 5

## 2023-02-28 MED ORDER — DEXMEDETOMIDINE HCL IN NACL 80 MCG/20ML IV SOLN
INTRAVENOUS | Status: DC | PRN
  Administered 2023-02-28: 20 ug via INTRAVENOUS

## 2023-02-28 MED ORDER — PROPOFOL 10 MG/ML IV BOLUS
INTRAVENOUS | Status: DC | PRN
  Administered 2023-02-28 (×2): 50 mg via INTRAVENOUS

## 2023-02-28 MED ORDER — SODIUM CHLORIDE 0.9 % IV SOLN: INTRAVENOUS | Status: DC

## 2023-02-28 MED ORDER — LIDOCAINE HCL (CARDIAC) PF 100 MG/5ML IV SOSY
PREFILLED_SYRINGE | INTRAVENOUS | Status: DC | PRN
  Administered 2023-02-28: 80 mg via INTRAVENOUS

## 2023-02-28 NOTE — Anesthesia Postprocedure Evaluation (Signed)
Anesthesia Post Note  Patient: Darius Robinson  Procedure(s) Performed: COLONOSCOPY WITH PROPOFOL POLYPECTOMY  Patient location during evaluation: Endoscopy Anesthesia Type: General Level of consciousness: awake and alert Pain management: pain level controlled Vital Signs Assessment: post-procedure vital signs reviewed and stable Respiratory status: spontaneous breathing, nonlabored ventilation, respiratory function stable and patient connected to nasal cannula oxygen Cardiovascular status: blood pressure returned to baseline and stable Postop Assessment: no apparent nausea or vomiting Anesthetic complications: no   No notable events documented.   Last Vitals:  Vitals:   02/28/23 0821 02/28/23 0831  BP: 112/78 118/78  Pulse: 62   Resp: 16   Temp:    SpO2: 97%     Last Pain:  Vitals:   02/28/23 0811  TempSrc: Temporal  PainSc: Asleep                 Louie Boston

## 2023-02-28 NOTE — Op Note (Signed)
[facility] Gastroenterology Patient Name: Darius Robinson Procedure Date: 02/28/2023 7:33 AM MRN: [id number] Account #: [id number] Date of Birth: 1970/09/22 Admit Type: Outpatient Age: 52 Room: [facility] ENDO ROOM 3 Gender: Male Note Status: Finalized Instrument Name: Nelda Marseille [id number] Procedure:             Colonoscopy Indications:           Screening for colorectal malignant neoplasm, This is                         the patient's first colonoscopy Providers:             Toney Reil MD, MD Referring MD:          Demetrios Isaacs. Sherrie Mustache, MD (Referring MD) Medicines:             General Anesthesia Complications:         No immediate complications. Procedure:             Pre-Anesthesia Assessment:                        - Prior to the procedure, a History and Physical was                         performed, and patient medications and allergies were                         reviewed. The patient is competent. The risks and                         benefits of the procedure and the sedation options and                         risks were discussed with the patient. All questions                         were answered and informed consent was obtained.                         Patient identification and proposed procedure were                         verified by the physician, the nurse, the                         anesthesiologist, the anesthetist and the technician                         in the pre-procedure area in the procedure room in the                         endoscopy suite. Mental Status Examination: alert and                         oriented. Airway Examination: normal oropharyngeal                         airway and neck mobility. Respiratory Examination:  clear to auscultation. CV Examination: normal.                         Prophylactic Antibiotics: The patient does not require                         prophylactic antibiotics. Prior  Anticoagulants: The                         patient has taken no anticoagulant or antiplatelet                         agents. ASA Grade Assessment: II - A patient with mild                         systemic disease. After reviewing the risks and                         benefits, the patient was deemed in satisfactory                         condition to undergo the procedure. The anesthesia                         plan was to use general anesthesia. Immediately prior                         to administration of medications, the patient was                         re-assessed for adequacy to receive sedatives. The                         heart rate, respiratory rate, oxygen saturations,                         blood pressure, adequacy of pulmonary ventilation, and                         response to care were monitored throughout the                         procedure. The physical status of the patient was                         re-assessed after the procedure.                        After obtaining informed consent, the colonoscope was                         passed under direct vision. Throughout the procedure,                         the patient's blood pressure, pulse, and oxygen                         saturations were monitored continuously. The  Colonoscope was introduced through the anus and                         advanced to the the cecum, identified by appendiceal                         orifice and ileocecal valve. The colonoscopy was                         performed without difficulty. The patient tolerated                         the procedure well. The quality of the bowel                         preparation was evaluated using the BBPS [facility] Bowel                         Preparation Scale) with scores of: Right Colon = 3,                         Transverse Colon = 3 and Left Colon = 3 (entire mucosa                         seen well with no residual  staining, small fragments                         of stool or opaque liquid). The total BBPS score                         equals 9. The ileocecal valve, appendiceal orifice,                         and rectum were photographed. Findings:      The perianal and digital rectal examinations were normal. Pertinent       negatives include normal sphincter tone and no palpable rectal lesions.      A diminutive polyp was found in the ascending colon. The polyp was       sessile. The polyp was removed with a jumbo cold forceps. Resection and       retrieval were complete. Estimated blood loss: none.      A few diverticula were found in the descending colon and cecum.      The retroflexed view of the distal rectum and anal verge was normal and       showed no anal or rectal abnormalities. Impression:            - One diminutive polyp in the ascending colon, removed                         with a jumbo cold forceps. Resected and retrieved.                        - Diverticulosis in the descending colon and in the                         cecum.                        -  The distal rectum and anal verge are normal on                         retroflexion view. Recommendation:        - Discharge patient to home (with escort).                        - Resume previous diet today.                        - Continue present medications.                        - Await pathology results.                        - Repeat colonoscopy in 7-10 years for surveillance                         based on pathology results. Procedure Code(s):     --- Professional ---                        [phone], Colonoscopy, flexible; with biopsy, single or                         multiple Diagnosis Code(s):     --- Professional ---                        Z12.11, Encounter for screening for malignant neoplasm                         of colon                        D12.2, Benign neoplasm of ascending colon                         K57.30, Diverticulosis of large intestine without                         perforation or abscess without bleeding CPT copyright 2022 American [employer]. All rights reserved. The codes documented in this report are preliminary and upon coder review may  be revised to meet current compliance requirements. Dr. Libby Maw Toney Reil MD, MD 02/28/2023 8:10:55 AM This report has been signed electronically. Number of Addenda: 0 Note Initiated On: 02/28/2023 7:33 AM Scope Withdrawal Time: 0 hours 8 minutes 9 seconds  Total Procedure Duration: 0 hours 10 minutes 51 seconds  Estimated Blood Loss:  Estimated blood loss: none.      [facility]

## 2023-02-28 NOTE — Transfer of Care (Signed)
Immediate Anesthesia Transfer of Care Note  Patient: Darius Robinson  Procedure(s) Performed: COLONOSCOPY WITH PROPOFOL  Patient Location: PACU  Anesthesia Type:General  Level of Consciousness: sedated  Airway & Oxygen Therapy: Patient Spontanous Breathing  Post-op Assessment: Report given to RN and Post -op Vital signs reviewed and stable  Post vital signs: Reviewed and stable  Last Vitals:  Vitals Value Taken Time  BP 98/70 02/28/23 0811  Temp 35.8 C 02/28/23 0811  Pulse 66 02/28/23 0811  Resp 16 02/28/23 0811  SpO2 98 % 02/28/23 0811    Last Pain:  Vitals:   02/28/23 0811  TempSrc: Temporal  PainSc: Asleep         Complications: No notable events documented.

## 2023-02-28 NOTE — H&P (Signed)
  Arlyss Repress, MD [address]  [address]  [place], [place] [address]  Main: [phone]  Fax: [phone] Pager: [phone]  Primary Care Physician:  Malva Limes, MD Primary Gastroenterologist:  Dr. Arlyss Repress  Pre-Procedure History & Physical: HPI:  Darius Robinson is a 52 y.o. male is here for an colonoscopy.   Past Medical History:  Diagnosis Date   Gout     Past Surgical History:  Procedure Laterality Date   ACHILLES TENDON SURGERY      Prior to Admission medications   Medication Sig Start Date End Date Taking? Authorizing Provider  colchicine 0.6 MG tablet Take 2 tablets for one day, then one daily as needed. 09/08/17   Anola Gurney, PA    Allergies as of 01/06/2023   (No Known Allergies)    History reviewed. No pertinent family history.  Social History   Socioeconomic History   Marital status: Married    Spouse name: Not on file   Number of children: Not on file   Years of education: Not on file   Highest education level: Not on file  Occupational History   Not on file  Tobacco Use   Smoking status: Never   Smokeless tobacco: Former    Types: Chew    Quit date: 03/11/2001  Vaping Use   Vaping status: Never Used  Substance and Sexual Activity   Alcohol use: Yes    Comment: socially   Drug use: Never   Sexual activity: Not on file  Other Topics Concern   Not on file  Social History Narrative   Not on file   Social Drivers of Health   Financial Resource Strain: Not on file  Food Insecurity: Not on file  Transportation Needs: Not on file  Physical Activity: Not on file  Stress: Not on file  Social Connections: Not on file  Intimate Partner Violence: Not on file    Review of Systems: See HPI, otherwise negative ROS  Physical Exam: BP (!) 130/95   Pulse 64   Temp (!) 96.9 F (36.1 C) (Temporal)   Resp 17   Ht 6' (1.829 m)   Wt 93.8 kg   SpO2 99%   BMI 28.05 kg/m  General:   Alert,  pleasant and cooperative in  NAD Head:  Normocephalic and atraumatic. Neck:  Supple; no masses or thyromegaly. Lungs:  Clear throughout to auscultation.    Heart:  Regular rate and rhythm. Abdomen:  Soft, nontender and nondistended. Normal bowel sounds, without guarding, and without rebound.   Neurologic:  Alert and  oriented x4;  grossly normal neurologically.  Impression/Plan: Darius Robinson is here for an colonoscopy to be performed for colon cancer screening  Risks, benefits, limitations, and alternatives regarding  colonoscopy have been reviewed with the patient.  Questions have been answered.  All parties agreeable.   Lannette Donath, MD  02/28/2023, 7:41 AM

## 2023-02-28 NOTE — Anesthesia Preprocedure Evaluation (Signed)
Anesthesia Evaluation  Patient identified by MRN, date of birth, ID band Patient awake    Reviewed: Allergy & Precautions, NPO status , Patient's Chart, lab work & pertinent test results  History of Anesthesia Complications Negative for: history of anesthetic complications  Airway Mallampati: II  TM Distance: >3 FB Neck ROM: full    Dental no notable dental hx.    Pulmonary neg pulmonary ROS   Pulmonary exam normal        Cardiovascular Normal cardiovascular exam     Neuro/Psych negative neurological ROS  negative psych ROS   GI/Hepatic negative GI ROS, Neg liver ROS,,,  Endo/Other  negative endocrine ROS    Renal/GU negative Renal ROS  negative genitourinary   Musculoskeletal   Abdominal   Peds  Hematology negative hematology ROS (+)   Anesthesia Other Findings Past Medical History: No date: Gout  Past Surgical History: No date: ACHILLES TENDON SURGERY  BMI 30   Reproductive/Obstetrics negative OB ROS                             Anesthesia Physical Anesthesia Plan  ASA: 2  Anesthesia Plan: General   Post-op Pain Management: Minimal or no pain anticipated   Induction: Intravenous  PONV Risk Score and Plan: 1 and Propofol infusion and TIVA  Airway Management Planned: Natural Airway and Nasal Cannula  Additional Equipment:   Intra-op Plan:   Post-operative Plan:   Informed Consent: I have reviewed the patients History and Physical, chart, labs and discussed the procedure including the risks, benefits and alternatives for the proposed anesthesia with the patient or authorized representative who has indicated his/her understanding and acceptance.     Dental Advisory Given  Plan Discussed with: Anesthesiologist, CRNA and Surgeon  Anesthesia Plan Comments: (Patient consented for risks of anesthesia including but not limited to:  - adverse reactions to medications -  risk of airway placement if required - damage to eyes, teeth, lips or other oral mucosa - nerve damage due to positioning  - sore throat or hoarseness - Damage to heart, brain, nerves, lungs, other parts of body or loss of life  Patient voiced understanding and assent.)        Anesthesia Quick Evaluation

## 2023-03-04 ENCOUNTER — Encounter: Payer: Self-pay | Admitting: Gastroenterology

## 2023-03-04 LAB — SURGICAL PATHOLOGY
